# Patient Record
Sex: Female | Born: 1935 | Race: White | Hispanic: No | Smoking: Never smoker
Health system: Southern US, Community
[De-identification: ages and names within clinical notes are randomized; demographics above are authoritative.]

## PROBLEM LIST (undated history)

## (undated) DIAGNOSIS — D649 Anemia, unspecified: Secondary | ICD-10-CM

## (undated) DIAGNOSIS — F411 Generalized anxiety disorder: Secondary | ICD-10-CM

## (undated) DIAGNOSIS — E1129 Type 2 diabetes mellitus with other diabetic kidney complication: Secondary | ICD-10-CM

## (undated) DIAGNOSIS — M545 Low back pain: Secondary | ICD-10-CM

## (undated) DIAGNOSIS — E039 Hypothyroidism, unspecified: Secondary | ICD-10-CM

## (undated) DIAGNOSIS — J45909 Unspecified asthma, uncomplicated: Secondary | ICD-10-CM

## (undated) DIAGNOSIS — N183 Chronic kidney disease, stage 3 unspecified: Secondary | ICD-10-CM

## (undated) DIAGNOSIS — K219 Gastro-esophageal reflux disease without esophagitis: Secondary | ICD-10-CM

## (undated) DIAGNOSIS — G4700 Insomnia, unspecified: Secondary | ICD-10-CM

## (undated) DIAGNOSIS — E119 Type 2 diabetes mellitus without complications: Secondary | ICD-10-CM

## (undated) DIAGNOSIS — IMO0002 Reserved for concepts with insufficient information to code with codable children: Secondary | ICD-10-CM

## (undated) DIAGNOSIS — G4730 Sleep apnea, unspecified: Secondary | ICD-10-CM

## (undated) DIAGNOSIS — K5900 Constipation, unspecified: Secondary | ICD-10-CM

## (undated) DIAGNOSIS — I2510 Atherosclerotic heart disease of native coronary artery without angina pectoris: Secondary | ICD-10-CM

## (undated) DIAGNOSIS — N289 Disorder of kidney and ureter, unspecified: Secondary | ICD-10-CM

## (undated) DIAGNOSIS — E785 Hyperlipidemia, unspecified: Secondary | ICD-10-CM

## (undated) DIAGNOSIS — I10 Essential (primary) hypertension: Secondary | ICD-10-CM

## (undated) DIAGNOSIS — I219 Acute myocardial infarction, unspecified: Secondary | ICD-10-CM

## (undated) DIAGNOSIS — M4800 Spinal stenosis, site unspecified: Secondary | ICD-10-CM

## (undated) DIAGNOSIS — E871 Hypo-osmolality and hyponatremia: Secondary | ICD-10-CM

## (undated) DIAGNOSIS — E669 Obesity, unspecified: Secondary | ICD-10-CM

## (undated) DIAGNOSIS — R809 Proteinuria, unspecified: Secondary | ICD-10-CM

## (undated) DIAGNOSIS — M1990 Unspecified osteoarthritis, unspecified site: Secondary | ICD-10-CM

## (undated) DIAGNOSIS — R079 Chest pain, unspecified: Secondary | ICD-10-CM

## (undated) HISTORY — DX: Chronic kidney disease, stage 3 unspecified: N18.30

## (undated) HISTORY — DX: Spinal stenosis, site unspecified: M48.00

## (undated) HISTORY — DX: Sleep apnea, unspecified: G47.30

## (undated) HISTORY — PX: POSTERIOR LAMINECTOMY / DECOMPRESSION LUMBAR SPINE: SUR740

## (undated) HISTORY — DX: Proteinuria, unspecified: R80.9

## (undated) HISTORY — PX: CHOLECYSTECTOMY, LAPAROSCOPIC: SHX56

## (undated) HISTORY — DX: Disorder of kidney and ureter, unspecified: N28.9

## (undated) HISTORY — DX: Anemia, unspecified: D64.9

## (undated) HISTORY — DX: Reserved for concepts with insufficient information to code with codable children: IMO0002

## (undated) HISTORY — DX: Type 2 diabetes mellitus with other diabetic kidney complication: E11.29

## (undated) HISTORY — DX: Obesity, unspecified: E66.9

## (undated) HISTORY — DX: Generalized anxiety disorder: F41.1

## (undated) HISTORY — DX: Atherosclerotic heart disease of native coronary artery without angina pectoris: I25.10

## (undated) HISTORY — DX: Insomnia, unspecified: G47.00

## (undated) HISTORY — DX: Hypothyroidism, unspecified: E03.9

## (undated) HISTORY — DX: Hyperlipidemia, unspecified: E78.5

## (undated) HISTORY — DX: Constipation, unspecified: K59.00

## (undated) HISTORY — DX: Unspecified osteoarthritis, unspecified site: M19.90

## (undated) HISTORY — DX: Unspecified asthma, uncomplicated: J45.909

## (undated) HISTORY — DX: Chest pain, unspecified: R07.9

## (undated) HISTORY — DX: Low back pain: M54.5

## (undated) HISTORY — DX: Hypo-osmolality and hyponatremia: E87.1

## (undated) HISTORY — DX: Type 2 diabetes mellitus without complications: E11.9

## (undated) HISTORY — DX: Chronic kidney disease, stage 3 (moderate): N18.3

## (undated) HISTORY — DX: Gastro-esophageal reflux disease without esophagitis: K21.9

## (undated) HISTORY — DX: Morbid (severe) obesity due to excess calories: E66.01

## (undated) HISTORY — DX: Acute myocardial infarction, unspecified: I21.9

## (undated) HISTORY — DX: Essential (primary) hypertension: I10

---

## 1990-12-24 HISTORY — PX: TOTAL KNEE ARTHROPLASTY: SHX125

## 2003-06-11 ENCOUNTER — Ambulatory Visit (HOSPITAL_COMMUNITY): Admission: RE | Admit: 2003-06-11 | Discharge: 2003-06-11 | Payer: Self-pay | Admitting: Neurosurgery

## 2003-06-11 ENCOUNTER — Encounter: Payer: Self-pay | Admitting: Neurosurgery

## 2003-06-18 ENCOUNTER — Encounter: Payer: Self-pay | Admitting: Neurosurgery

## 2003-06-22 ENCOUNTER — Inpatient Hospital Stay (HOSPITAL_COMMUNITY): Admission: RE | Admit: 2003-06-22 | Discharge: 2003-06-24 | Payer: Self-pay | Admitting: Neurosurgery

## 2003-06-22 ENCOUNTER — Encounter: Payer: Self-pay | Admitting: Neurosurgery

## 2004-05-02 ENCOUNTER — Inpatient Hospital Stay (HOSPITAL_COMMUNITY): Admission: RE | Admit: 2004-05-02 | Discharge: 2004-05-04 | Payer: Self-pay | Admitting: Neurosurgery

## 2010-12-24 HISTORY — PX: SKIN CANCER EXCISION: SHX779

## 2011-01-13 ENCOUNTER — Encounter: Payer: Self-pay | Admitting: Neurosurgery

## 2011-11-09 ENCOUNTER — Other Ambulatory Visit: Payer: Self-pay

## 2011-11-09 ENCOUNTER — Other Ambulatory Visit: Payer: Self-pay | Admitting: *Deleted

## 2011-11-09 DIAGNOSIS — C490 Malignant neoplasm of connective and soft tissue of head, face and neck: Secondary | ICD-10-CM

## 2011-11-20 ENCOUNTER — Ambulatory Visit
Admission: RE | Admit: 2011-11-20 | Discharge: 2011-11-20 | Disposition: A | Discharge status: Home / Self Care | Payer: Self-pay | Source: Ambulatory Visit | Attending: *Deleted | Admitting: *Deleted

## 2011-11-20 DIAGNOSIS — C490 Malignant neoplasm of connective and soft tissue of head, face and neck: Secondary | ICD-10-CM

## 2011-11-20 MED ORDER — GADOBENATE DIMEGLUMINE 529 MG/ML IV SOLN
9.0000 mL | Freq: Once | INTRAVENOUS | Status: AC | PRN
  Administered 2011-11-20: 9 mL via INTRAVENOUS

## 2013-10-29 ENCOUNTER — Telehealth: Payer: Self-pay | Admitting: Family Medicine

## 2013-10-29 ENCOUNTER — Ambulatory Visit: Payer: BC Managed Care – HMO | Admitting: Family Medicine

## 2013-10-29 NOTE — Telephone Encounter (Signed)
Received medical records from [facility]. [place]  P: [phone] F: [phone]

## 2013-11-03 ENCOUNTER — Encounter: Payer: Self-pay | Admitting: Family Medicine

## 2013-11-03 ENCOUNTER — Ambulatory Visit (INDEPENDENT_AMBULATORY_CARE_PROVIDER_SITE_OTHER): Payer: BC Managed Care – HMO | Admitting: Family Medicine

## 2013-11-03 ENCOUNTER — Other Ambulatory Visit: Payer: Self-pay | Admitting: Family Medicine

## 2013-11-03 ENCOUNTER — Telehealth: Payer: Self-pay | Admitting: Family Medicine

## 2013-11-03 VITALS — BP 122/62 | HR 67 | Temp 97.6°F | Resp 16 | Ht 64.0 in | Wt 157.0 lb

## 2013-11-03 DIAGNOSIS — M545 Low back pain, unspecified: Secondary | ICD-10-CM

## 2013-11-03 DIAGNOSIS — I2510 Atherosclerotic heart disease of native coronary artery without angina pectoris: Secondary | ICD-10-CM

## 2013-11-03 DIAGNOSIS — D649 Anemia, unspecified: Secondary | ICD-10-CM

## 2013-11-03 DIAGNOSIS — Z23 Encounter for immunization: Secondary | ICD-10-CM

## 2013-11-03 DIAGNOSIS — R809 Proteinuria, unspecified: Secondary | ICD-10-CM

## 2013-11-03 DIAGNOSIS — M4800 Spinal stenosis, site unspecified: Secondary | ICD-10-CM | POA: Insufficient documentation

## 2013-11-03 DIAGNOSIS — I10 Essential (primary) hypertension: Secondary | ICD-10-CM

## 2013-11-03 DIAGNOSIS — E785 Hyperlipidemia, unspecified: Secondary | ICD-10-CM | POA: Insufficient documentation

## 2013-11-03 DIAGNOSIS — F411 Generalized anxiety disorder: Secondary | ICD-10-CM

## 2013-11-03 DIAGNOSIS — E039 Hypothyroidism, unspecified: Secondary | ICD-10-CM

## 2013-11-03 DIAGNOSIS — N058 Unspecified nephritic syndrome with other morphologic changes: Secondary | ICD-10-CM

## 2013-11-03 DIAGNOSIS — M1990 Unspecified osteoarthritis, unspecified site: Secondary | ICD-10-CM | POA: Insufficient documentation

## 2013-11-03 DIAGNOSIS — E871 Hypo-osmolality and hyponatremia: Secondary | ICD-10-CM

## 2013-11-03 DIAGNOSIS — G4700 Insomnia, unspecified: Secondary | ICD-10-CM

## 2013-11-03 DIAGNOSIS — G4730 Sleep apnea, unspecified: Secondary | ICD-10-CM

## 2013-11-03 DIAGNOSIS — J45909 Unspecified asthma, uncomplicated: Secondary | ICD-10-CM | POA: Insufficient documentation

## 2013-11-03 DIAGNOSIS — K5900 Constipation, unspecified: Secondary | ICD-10-CM

## 2013-11-03 DIAGNOSIS — IMO0002 Reserved for concepts with insufficient information to code with codable children: Secondary | ICD-10-CM | POA: Insufficient documentation

## 2013-11-03 DIAGNOSIS — K219 Gastro-esophageal reflux disease without esophagitis: Secondary | ICD-10-CM

## 2013-11-03 DIAGNOSIS — I219 Acute myocardial infarction, unspecified: Secondary | ICD-10-CM

## 2013-11-03 DIAGNOSIS — E669 Obesity, unspecified: Secondary | ICD-10-CM | POA: Insufficient documentation

## 2013-11-03 DIAGNOSIS — N289 Disorder of kidney and ureter, unspecified: Secondary | ICD-10-CM

## 2013-11-03 DIAGNOSIS — E1129 Type 2 diabetes mellitus with other diabetic kidney complication: Secondary | ICD-10-CM

## 2013-11-03 DIAGNOSIS — E119 Type 2 diabetes mellitus without complications: Secondary | ICD-10-CM

## 2013-11-03 DIAGNOSIS — R079 Chest pain, unspecified: Secondary | ICD-10-CM

## 2013-11-03 HISTORY — DX: Constipation, unspecified: K59.00

## 2013-11-03 HISTORY — DX: Low back pain, unspecified: M54.50

## 2013-11-03 MED ORDER — TRAMADOL HCL 50 MG PO TABS: 50.0000 mg | ORAL_TABLET | Freq: Three times a day (TID) | ORAL | Status: DC | PRN

## 2013-11-03 NOTE — Progress Notes (Signed)
Patient ID: Kathy Poole, female   DOB: 05-16-1929, 79 y.o.   MRN: [id number] Kathy Poole [id number] [birth year] 11/03/2013      Progress Note New Patient  Subjective  Chief Complaint  Chief Complaint  Patient presents with  . Establish Care    NP to Establish  . Immunizations    Pt request Flu & Pneumonia vaccines, has had pneumonia vaccine in past    HPI  Patient is a an 79 year old Caucasian female who is here today accompanied by her daughter. She has a significant past medical history which she has trouble providing succinctly. She is noted to have significant cardiac disease. Is seen in the [facility] a hospital and at [facility] for this. She has seen Dr. Baker Pierini at [facility] cardiology in the past as well. She had stents placed in 2006 and has recently been told she requires further stenting but has not followed up. Is having intermittent chest pain. It has been present for about 6 months. Occurs mostly with stress. She has episodes where it she feels short of breath and diaphoretic with the chest pain and other episodes where she does not. Denies palpitations. Does sometimes experience nausea. No pain in the office today. 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 She has suffered numerous falls and does have noted spinal stenosis and degenerative disease in her spine. Has chronic back pain. Also suffers with chronic constipation and chronic anxiety.   Past Medical History  Diagnosis Date  . HTN (hypertension)   . CAD (coronary artery disease)     s/p LAD & diagonal stent in 2006  . Hyperlipidemia   . GERD (gastroesophageal reflux disease)   . Diabetes type 2, controlled   . CKD (chronic kidney disease), stage III     from Diabetes  . Squamous cell carcinoma     Face  . Chest pain     w/palpitations, awaiting 3rd stent  . Osteoarthritis   . Spinal stenosis     Past Surgical  History  Procedure Laterality Date  . Total knee arthroplasty  1992  . Posterior laminectomy / decompression lumbar spine      x3  . Cholecystectomy, laparoscopic      Family History  Problem Relation Age of Onset  . Heart disease Brother   . Heart attack Mother   . Leukemia Father   . Pneumonia Mother     COD  . Renal Disease Mother   . Liver disease Brother   . Diabetes Brother     History   Social History  . Marital Status: Widowed    Spouse Name: N/A    Number of Children: N/A  . Years of Education: N/A   Occupational History  . Not on file.   Social History Main Topics  . Smoking status: Never Smoker   . Smokeless tobacco: Not on file  . Alcohol Use: No  . Drug Use: No  . Sexual Activity: Not on file   Other Topics Concern  . Not on file   Social History Narrative   Patient is a retired Manufacturing engineer. She denies tobacco, alcohol or illicit drugs.    No current outpatient prescriptions on file prior to visit.   No current facility-administered medications on file prior to visit.    Allergies  Allergen Reactions  . Diltiazem   . Morphine And Related   . Other     Duragesics.  Marland Kitchen  Penicillins   . Prednisone   . Rofecoxib   . Bacitracin Rash    Review of Systems  Review of Systems  Constitutional: Positive for malaise/fatigue. Negative for fever and chills.  HENT: Negative for congestion, hearing loss and nosebleeds.   Eyes: Negative for discharge.  Respiratory: Positive for shortness of breath. Negative for cough, sputum production and wheezing.   Cardiovascular: Positive for chest pain. Negative for palpitations and leg swelling.  Gastrointestinal: Positive for nausea. Negative for heartburn, vomiting, abdominal pain, diarrhea, constipation and blood in stool.  Genitourinary: Negative for dysuria, urgency, frequency and hematuria.  Musculoskeletal: Negative for back pain, falls and myalgias.  Skin: Negative for rash.  Neurological:  Negative for dizziness, tremors, sensory change, focal weakness, loss of consciousness, weakness and headaches.  Endo/Heme/Allergies: Negative for polydipsia. Does not bruise/bleed easily.  Psychiatric/Behavioral: Negative for depression and suicidal ideas. The patient is nervous/anxious. The patient does not have insomnia.     Objective  BP 122/62  Pulse 67  Temp(Src) 97.6 F (36.4 C) (Oral)  Resp 16  Ht 5\' 4"  (1.626 m)  Wt 157 lb (71.215 kg)  BMI 26.94 kg/m2  SpO2 98%  Physical Exam  Physical Exam  Constitutional: She is oriented to person, place, and time and well-developed, well-nourished, and in no distress. No distress.  HENT:  Head: Normocephalic and atraumatic.  Right Ear: External ear normal.  Left Ear: External ear normal.  Nose: Nose normal.  Mouth/Throat: Oropharynx is clear and moist. No oropharyngeal exudate.  Eyes: Conjunctivae are normal. Pupils are equal, round, and reactive to light. Right eye exhibits no discharge. Left eye exhibits no discharge. No scleral icterus.  Neck: Normal range of motion. Neck supple. No thyromegaly present.  Cardiovascular: Normal rate, regular rhythm, normal heart sounds and intact distal pulses.   No murmur heard. Pulmonary/Chest: Effort normal and breath sounds normal. No respiratory distress. She has no wheezes. She has no rales.  Abdominal: Soft. Bowel sounds are normal. She exhibits no distension and no mass. There is no tenderness.  Musculoskeletal: Normal range of motion. She exhibits no edema and no tenderness.  Lymphadenopathy:    She has no cervical adenopathy.  Neurological: She is alert and oriented to person, place, and time. She has normal reflexes. No cranial nerve deficit. Coordination normal.  Skin: Skin is warm and dry. No rash noted. She is not diaphoretic.  Scar right temple  Psychiatric: Mood, memory and affect normal.       Assessment & Plan  HTN (hypertension) Well controlled at today's visit, no  changes to meds  CAD (coronary artery disease) Patient requests referral to new cardiologist, appt scheduled for 11/12 but patient cancelled. She reports significant CAD and stenosis and records confirm. She does have intermittent CP often associated with stress and this has been ongoing for roughly 6 months. She reports when she relaxes it improves. She is encouraged to proceed with specialty care and to present to ED if pain occurs and does not remit.  Type 2 diabetes, controlled, with renal manifestation hgba1c 7.8. Patient encouraged to avoid simple carbs and continue current meds. paient encouraged to consider nutritionist appt in future. Reviewed old records. Renal insufficiency essentially stable  Unspecified constipation Encouraged probiotics, fiber supplements and plenty of fluids  Hypothyroid tsh wnl, continue current dose of levothyroxine  Proteinuria On Lisinopril  Anemia Mild and stable

## 2013-11-03 NOTE — Telephone Encounter (Signed)
Received medical records from [facility]  P: [phone] F: [phone]

## 2013-11-03 NOTE — Patient Instructions (Signed)
Probiotic daily Digestive Advantage daily MV with iron and minerals every other day Fiber such as Metamucil or Benefiber Every 3rd day if no BM 4 oz of warm prunejuice with 2 tbls of Milk of Mag if no results in 4 hours repeat with a Dulcolax suppository in the rectum    Preventive Care for Adults, Female A healthy lifestyle and preventive care can promote health and wellness. Preventive health guidelines for women include the following key practices.  A routine yearly physical is a good way to check with your caregiver about your health and preventive screening. It is a chance to share any concerns and updates on your health, and to receive a thorough exam.  Visit your dentist for a routine exam and preventive care every 6 months. Brush your teeth twice a day and floss once a day. Good oral hygiene prevents tooth decay and gum disease.  The frequency of eye exams is based on your age, health, family medical history, use of contact lenses, and other factors. Follow your caregiver's recommendations for frequency of eye exams.  Eat a healthy diet. Foods like vegetables, fruits, whole grains, low-fat dairy products, and lean protein foods contain the nutrients you need without too many calories. Decrease your intake of foods high in solid fats, added sugars, and salt. Eat the right amount of calories for you.Get information about a proper diet from your caregiver, if necessary.  Regular physical exercise is one of the most important things you can do for your health. Most adults should get at least 150 minutes of moderate-intensity exercise (any activity that increases your heart rate and causes you to sweat) each week. In addition, most adults need muscle-strengthening exercises on 2 or more days a week.  Maintain a healthy weight. The body mass index (BMI) is a screening tool to identify possible weight problems. It provides an estimate of body fat based on height and weight. Your caregiver can  help determine your BMI, and can help you achieve or maintain a healthy weight.For adults 20 years and older:  A BMI below 18.5 is considered underweight.  A BMI of 18.5 to 24.9 is normal.  A BMI of 25 to 29.9 is considered overweight.  A BMI of 30 and above is considered obese.  Maintain normal blood lipids and cholesterol levels by exercising and minimizing your intake of saturated fat. Eat a balanced diet with plenty of fruit and vegetables. Blood tests for lipids and cholesterol should begin at age 57 and be repeated every 5 years. If your lipid or cholesterol levels are high, you are over 50, or you are at high risk for heart disease, you may need your cholesterol levels checked more frequently.Ongoing high lipid and cholesterol levels should be treated with medicines if diet and exercise are not effective.  If you smoke, find out from your caregiver how to quit. If you do not use tobacco, do not start.  Lung cancer screening is recommended for adults aged 16 80 years who are at high risk for developing lung cancer because of a history of smoking. Yearly low-dose computed tomography (CT) is recommended for people who have at least a 30-pack-year history of smoking and are a current smoker or have quit within the past 15 years. A pack year of smoking is smoking an average of 1 pack of cigarettes a day for 1 year (for example: 1 pack a day for 30 years or 2 packs a day for 15 years). Yearly screening should continue until  the smoker has stopped smoking for at least 15 years. Yearly screening should also be stopped for people who develop a health problem that would prevent them from having lung cancer treatment.  If you are pregnant, do not drink alcohol. If you are breastfeeding, be very cautious about drinking alcohol. If you are not pregnant and choose to drink alcohol, do not exceed 1 drink per day. One drink is considered to be 12 ounces (355 mL) of beer, 5 ounces (148 mL) of wine, or 1.5  ounces (44 mL) of liquor.  Avoid use of street drugs. Do not share needles with anyone. Ask for help if you need support or instructions about stopping the use of drugs.  High blood pressure causes heart disease and increases the risk of stroke. Your blood pressure should be checked at least every 1 to 2 years. Ongoing high blood pressure should be treated with medicines if weight loss and exercise are not effective.  If you are 64 to 77 years old, ask your caregiver if you should take aspirin to prevent strokes.  Diabetes screening involves taking a blood sample to check your fasting blood sugar level. This should be done once every 3 years, after age 37, if you are within normal weight and without risk factors for diabetes. Testing should be considered at a younger age or be carried out more frequently if you are overweight and have at least 1 risk factor for diabetes.  Breast cancer screening is essential preventive care for women. You should practice "breast self-awareness." This means understanding the normal appearance and feel of your breasts and may include breast self-examination. Any changes detected, no matter how small, should be reported to a caregiver. Women in their 49s and 30s should have a clinical breast exam (CBE) by a caregiver as part of a regular health exam every 1 to 3 years. After age 82, women should have a CBE every year. Starting at age 56, women should consider having a mammography (breast X-ray test) every year. Women who have a family history of breast cancer should talk to their caregiver about genetic screening. Women at a high risk of breast cancer should talk to their caregivers about having magnetic resonance imaging (MRI) and a mammography every year.  Breast cancer gene (BRCA)-related cancer risk assessment is recommended for women who have family members with BRCA-related cancers. BRCA-related cancers include breast, ovarian, tubal, and peritoneal cancers. Having  family members with these cancers may be associated with an increased risk for harmful changes (mutations) in the breast cancer genes BRCA1 and BRCA2. Results of the assessment will determine the need for genetic counseling and BRCA1 and BRCA2 testing.  The Pap test is a screening test for cervical cancer. A Pap test can show cell changes on the cervix that might become cervical cancer if left untreated. A Pap test is a procedure in which cells are obtained and examined from the lower end of the uterus (cervix).  Women should have a Pap test starting at age 81.  Between ages 46 and 28, Pap tests should be repeated every 2 years.  Beginning at age 60, you should have a Pap test every 3 years as long as the past 3 Pap tests have been normal.  Some women have medical problems that increase the chance of getting cervical cancer. Talk to your caregiver about these problems. It is especially important to talk to your caregiver if a new problem develops soon after your last Pap test. In these  cases, your caregiver may recommend more frequent screening and Pap tests.  The above recommendations are the same for women who have or have not gotten the vaccine for human papillomavirus (HPV).  If you had a hysterectomy for a problem that was not cancer or a condition that could lead to cancer, then you no longer need Pap tests. Even if you no longer need a Pap test, a regular exam is a good idea to make sure no other problems are starting.  If you are between ages 19 and 17, and you have had normal Pap tests going back 10 years, you no longer need Pap tests. Even if you no longer need a Pap test, a regular exam is a good idea to make sure no other problems are starting.  If you have had past treatment for cervical cancer or a condition that could lead to cancer, you need Pap tests and screening for cancer for at least 20 years after your treatment.  If Pap tests have been discontinued, risk factors (such as a  new sexual partner) need to be reassessed to determine if screening should be resumed.  The HPV test is an additional test that may be used for cervical cancer screening. The HPV test looks for the virus that can cause the cell changes on the cervix. The cells collected during the Pap test can be tested for HPV. The HPV test could be used to screen women aged 106 years and older, and should be used in women of any age who have unclear Pap test results. After the age of 42, women should have HPV testing at the same frequency as a Pap test.  Colorectal cancer can be detected and often prevented. Most routine colorectal cancer screening begins at the age of 74 and continues through age 3. However, your caregiver may recommend screening at an earlier age if you have risk factors for colon cancer. On a yearly basis, your caregiver may provide home test kits to check for hidden blood in the stool. Use of a small camera at the end of a tube, to directly examine the colon (sigmoidoscopy or colonoscopy), can detect the earliest forms of colorectal cancer. Talk to your caregiver about this at age 63, when routine screening begins. Direct examination of the colon should be repeated every 5 to 10 years through age 12, unless early forms of pre-cancerous polyps or small growths are found.  Hepatitis C blood testing is recommended for all people born from [birth year] through 1963 and any individual with known risks for hepatitis C.  Practice safe sex. Use condoms and avoid high-risk sexual practices to reduce the spread of sexually transmitted infections (STIs). STIs include gonorrhea, chlamydia, syphilis, trichomonas, herpes, HPV, and human immunodeficiency virus (HIV). Herpes, HIV, and HPV are viral illnesses that have no cure. They can result in disability, cancer, and death. Sexually active women aged 58 and younger should be checked for chlamydia. Older women with new or multiple partners should also be tested for  chlamydia. Testing for other STIs is recommended if you are sexually active and at increased risk.  Osteoporosis is a disease in which the bones lose minerals and strength with aging. This can result in serious bone fractures. The risk of osteoporosis can be identified using a bone density scan. Women ages 17 and over and women at risk for fractures or osteoporosis should discuss screening with their caregivers. Ask your caregiver whether you should take a calcium supplement or vitamin D to reduce  the rate of osteoporosis.  Menopause can be associated with physical symptoms and risks. Hormone replacement therapy is available to decrease symptoms and risks. You should talk to your caregiver about whether hormone replacement therapy is right for you.  Use sunscreen. Apply sunscreen liberally and repeatedly throughout the day. You should seek shade when your shadow is shorter than you. Protect yourself by wearing long sleeves, pants, a wide-brimmed hat, and sunglasses year round, whenever you are outdoors.  Once a month, do a whole body skin exam, using a mirror to look at the skin on your back. Notify your caregiver of new moles, moles that have irregular borders, moles that are larger than a pencil eraser, or moles that have changed in shape or color.  Stay current with required immunizations.  Influenza vaccine. All adults should be immunized every year.  Tetanus, diphtheria, and acellular pertussis (Td, Tdap) vaccine. Pregnant women should receive 1 dose of Tdap vaccine during each pregnancy. The dose should be obtained regardless of the length of time since the last dose. Immunization is preferred during the 27th to 36th week of gestation. An adult who has not previously received Tdap or who does not know her vaccine status should receive 1 dose of Tdap. This initial dose should be followed by tetanus and diphtheria toxoids (Td) booster doses every 10 years. Adults with an unknown or incomplete  history of completing a 3-dose immunization series with Td-containing vaccines should begin or complete a primary immunization series including a Tdap dose. Adults should receive a Td booster every 10 years.  Varicella vaccine. An adult without evidence of immunity to varicella should receive 2 doses or a second dose if she has previously received 1 dose. Pregnant females who do not have evidence of immunity should receive the first dose after pregnancy. This first dose should be obtained before leaving the health care facility. The second dose should be obtained 4 8 weeks after the first dose.  Human papillomavirus (HPV) vaccine. Females aged 12 26 years who have not received the vaccine previously should obtain the 3-dose series. The vaccine is not recommended for use in pregnant females. However, pregnancy testing is not needed before receiving a dose. If a female is found to be pregnant after receiving a dose, no treatment is needed. In that case, the remaining doses should be delayed until after the pregnancy. Immunization is recommended for any person with an immunocompromised condition through the age of 26 years if she did not get any or all doses earlier. During the 3-dose series, the second dose should be obtained 4 8 weeks after the first dose. The third dose should be obtained 24 weeks after the first dose and 16 weeks after the second dose.  Zoster vaccine. One dose is recommended for adults aged 49 years or older unless certain conditions are present.  Measles, mumps, and rubella (MMR) vaccine. Adults born before [birth year] generally are considered immune to measles and mumps. Adults born in [birth year] or later should have 1 or more doses of MMR vaccine unless there is a contraindication to the vaccine or there is laboratory evidence of immunity to each of the three diseases. A routine second dose of MMR vaccine should be obtained at least 28 days after the first dose for students attending postsecondary  schools, health care workers, or international travelers. People who received inactivated measles vaccine or an unknown type of measles vaccine during 1963 1967 should receive 2 doses of MMR vaccine. People who received inactivated mumps  vaccine or an unknown type of mumps vaccine before 1979 and are at high risk for mumps infection should consider immunization with 2 doses of MMR vaccine. For females of childbearing age, rubella immunity should be determined. If there is no evidence of immunity, females who are not pregnant should be vaccinated. If there is no evidence of immunity, females who are pregnant should delay immunization until after pregnancy. Unvaccinated health care workers born before [birth year] who lack laboratory evidence of measles, mumps, or rubella immunity or laboratory confirmation of disease should consider measles and mumps immunization with 2 doses of MMR vaccine or rubella immunization with 1 dose of MMR vaccine.  Pneumococcal 13-valent conjugate (PCV13) vaccine. When indicated, a person who is uncertain of her immunization history and has no record of immunization should receive the PCV13 vaccine. An adult aged 63 years or older who has certain medical conditions and has not been previously immunized should receive 1 dose of PCV13 vaccine. This PCV13 should be followed with a dose of pneumococcal polysaccharide (PPSV23) vaccine. The PPSV23 vaccine dose should be obtained at least 8 weeks after the dose of PCV13 vaccine. An adult aged 26 years or older who has certain medical conditions and previously received 1 or more doses of PPSV23 vaccine should receive 1 dose of PCV13. The PCV13 vaccine dose should be obtained 1 or more years after the last PPSV23 vaccine dose.  Pneumococcal polysaccharide (PPSV23) vaccine. When PCV13 is also indicated, PCV13 should be obtained first. All adults aged 73 years and older should be immunized. An adult younger than age 50 years who has certain medical  conditions should be immunized. Any person who resides in a nursing home or long-term care facility should be immunized. An adult smoker should be immunized. People with an immunocompromised condition and certain other conditions should receive both PCV13 and PPSV23 vaccines. People with human immunodeficiency virus (HIV) infection should be immunized as soon as possible after diagnosis. Immunization during chemotherapy or radiation therapy should be avoided. Routine use of PPSV23 vaccine is not recommended for American Indians, [address], or people younger than 65 years unless there are medical conditions that require PPSV23 vaccine. When indicated, people who have unknown immunization and have no record of immunization should receive PPSV23 vaccine. One-time revaccination 5 years after the first dose of PPSV23 is recommended for people aged 57 64 years who have chronic kidney failure, nephrotic syndrome, asplenia, or immunocompromised conditions. People who received 1 2 doses of PPSV23 before age 30 years should receive another dose of PPSV23 vaccine at age 56 years or later if at least 5 years have passed since the previous dose. Doses of PPSV23 are not needed for people immunized with PPSV23 at or after age 31 years.  Meningococcal vaccine. Adults with asplenia or persistent complement component deficiencies should receive 2 doses of quadrivalent meningococcal conjugate (MenACWY-D) vaccine. The doses should be obtained at least 2 months apart. Microbiologists working with certain meningococcal bacteria, military recruits, people at risk during an outbreak, and people who travel to or live in countries with a high rate of meningitis should be immunized. A first-year college student up through age 60 years who is living in a residence hall should receive a dose if she did not receive a dose on or after her 16th birthday. Adults who have certain high-risk conditions should receive one or more doses of  vaccine.  Hepatitis A vaccine. Adults who wish to be protected from this disease, have certain high-risk conditions, work with  hepatitis A-infected animals, work in hepatitis A research labs, or travel to or work in countries with a high rate of hepatitis A should be immunized. Adults who were previously unvaccinated and who anticipate close contact with an international adoptee during the first 60 days after arrival in the [place] from a country with a high rate of hepatitis A should be immunized.  Hepatitis B vaccine. Adults who wish to be protected from this disease, have certain high-risk conditions, may be exposed to blood or other infectious body fluids, are household contacts or sex partners of hepatitis B positive people, are clients or workers in certain care facilities, or travel to or work in countries with a high rate of hepatitis B should be immunized.  Haemophilus influenzae type b (Hib) vaccine. A previously unvaccinated person with asplenia or sickle cell disease or having a scheduled splenectomy should receive 1 dose of Hib vaccine. Regardless of previous immunization, a recipient of a hematopoietic stem cell transplant should receive a 3-dose series 6 12 months after her successful transplant. Hib vaccine is not recommended for adults with HIV infection. Preventive Services / Frequency Ages 38 to 65  Blood pressure check.** / Every 1 to 2 years.  Lipid and cholesterol check.** / Every 5 years beginning at age 70.  Clinical breast exam.** / Every 3 years for women in their 46s and 30s.  BRCA-related cancer risk assessment.** / For women who have family members with a BRCA-related cancer (breast, ovarian, tubal, or peritoneal cancers).  Pap test.** / Every 2 years from ages 63 through 70. Every 3 years starting at age 19 through age 81 or 106 with a history of 3 consecutive normal Pap tests.  HPV screening.** / Every 3 years from ages 26 through ages 49 to 51 with a history  of 3 consecutive normal Pap tests.  Hepatitis C blood test.** / For any individual with known risks for hepatitis C.  Skin self-exam. / Monthly.  Influenza vaccine. / Every year.  Tetanus, diphtheria, and acellular pertussis (Tdap, Td) vaccine.** / Consult your caregiver. Pregnant women should receive 1 dose of Tdap vaccine during each pregnancy. 1 dose of Td every 10 years.  Varicella vaccine.** / Consult your caregiver. Pregnant females who do not have evidence of immunity should receive the first dose after pregnancy.  HPV vaccine. / 3 doses over 6 months, if 26 and younger. The vaccine is not recommended for use in pregnant females. However, pregnancy testing is not needed before receiving a dose.  Measles, mumps, rubella (MMR) vaccine.** / You need at least 1 dose of MMR if you were born in 1955 or later. You may also need a 2nd dose. For females of childbearing age, rubella immunity should be determined. If there is no evidence of immunity, females who are not pregnant should be vaccinated. If there is no evidence of immunity, females who are pregnant should delay immunization until after pregnancy.  Pneumococcal 13-valent conjugate (PCV13) vaccine.** / Consult your caregiver.  Pneumococcal polysaccharide (PPSV23) vaccine.** / 1 to 2 doses if you smoke cigarettes or if you have certain conditions.  Meningococcal vaccine.** / 1 dose if you are age 24 to 65 years and a Orthoptist living in a residence hall, or have one of several medical conditions, you need to get vaccinated against meningococcal disease. You may also need additional booster doses.  Hepatitis A vaccine.** / Consult your caregiver.  Hepatitis B vaccine.** / Consult your caregiver.  Haemophilus influenzae type b (Hib)  vaccine.** / Consult your caregiver. Ages 75 to 35  Blood pressure check.** / Every 1 to 2 years.  Lipid and cholesterol check.** / Every 5 years beginning at age 67.  Lung cancer  screening. / Every year if you are aged 26 80 years and have a 30-pack-year history of smoking and currently smoke or have quit within the past 15 years. Yearly screening is stopped once you have quit smoking for at least 15 years or develop a health problem that would prevent you from having lung cancer treatment.  Clinical breast exam.** / Every year after age 44.  BRCA-related cancer risk assessment.** / For women who have family members with a BRCA-related cancer (breast, ovarian, tubal, or peritoneal cancers).  Mammogram.** / Every year beginning at age 33 and continuing for as long as you are in good health. Consult with your caregiver.  Pap test.** / Every 3 years starting at age 69 through age 66 or 25 with a history of 3 consecutive normal Pap tests.  HPV screening.** / Every 3 years from ages 92 through ages 2 to 77 with a history of 3 consecutive normal Pap tests.  Fecal occult blood test (FOBT) of stool. / Every year beginning at age 80 and continuing until age 60. You may not need to do this test if you get a colonoscopy every 10 years.  Flexible sigmoidoscopy or colonoscopy.** / Every 5 years for a flexible sigmoidoscopy or every 10 years for a colonoscopy beginning at age 32 and continuing until age 20.  Hepatitis C blood test.** / For all people born from [birth year] through 1963 and any individual with known risks for hepatitis C.  Skin self-exam. / Monthly.  Influenza vaccine. / Every year.  Tetanus, diphtheria, and acellular pertussis (Tdap/Td) vaccine.** / Consult your caregiver. Pregnant women should receive 1 dose of Tdap vaccine during each pregnancy. 1 dose of Td every 10 years.  Varicella vaccine.** / Consult your caregiver. Pregnant females who do not have evidence of immunity should receive the first dose after pregnancy.  Zoster vaccine.** / 1 dose for adults aged 46 years or older.  Measles, mumps, rubella (MMR) vaccine.** / You need at least 1 dose of MMR if you  were born in 1957 or later. You may also need a 2nd dose. For females of childbearing age, rubella immunity should be determined. If there is no evidence of immunity, females who are not pregnant should be vaccinated. If there is no evidence of immunity, females who are pregnant should delay immunization until after pregnancy.  Pneumococcal 13-valent conjugate (PCV13) vaccine.** / Consult your caregiver.  Pneumococcal polysaccharide (PPSV23) vaccine.** / 1 to 2 doses if you smoke cigarettes or if you have certain conditions.  Meningococcal vaccine.** / Consult your caregiver.  Hepatitis A vaccine.** / Consult your caregiver.  Hepatitis B vaccine.** / Consult your caregiver.  Haemophilus influenzae type b (Hib) vaccine.** / Consult your caregiver. Ages 48 and over  Blood pressure check.** / Every 1 to 2 years.  Lipid and cholesterol check.** / Every 5 years beginning at age 70.  Lung cancer screening. / Every year if you are aged 6 80 years and have a 30-pack-year history of smoking and currently smoke or have quit within the past 15 years. Yearly screening is stopped once you have quit smoking for at least 15 years or develop a health problem that would prevent you from having lung cancer treatment.  Clinical breast exam.** / Every year after age 80.  BRCA-related  cancer risk assessment.** / For women who have family members with a BRCA-related cancer (breast, ovarian, tubal, or peritoneal cancers).  Mammogram.** / Every year beginning at age 34 and continuing for as long as you are in good health. Consult with your caregiver.  Pap test.** / Every 3 years starting at age 44 through age 50 or 62 with a 3 consecutive normal Pap tests. Testing can be stopped between 65 and 70 with 3 consecutive normal Pap tests and no abnormal Pap or HPV tests in the past 10 years.  HPV screening.** / Every 3 years from ages 67 through ages 67 or 11 with a history of 3 consecutive normal Pap tests.  Testing can be stopped between 65 and 70 with 3 consecutive normal Pap tests and no abnormal Pap or HPV tests in the past 10 years.  Fecal occult blood test (FOBT) of stool. / Every year beginning at age 69 and continuing until age 41. You may not need to do this test if you get a colonoscopy every 10 years.  Flexible sigmoidoscopy or colonoscopy.** / Every 5 years for a flexible sigmoidoscopy or every 10 years for a colonoscopy beginning at age 33 and continuing until age 17.  Hepatitis C blood test.** / For all people born from [birth year] through 1963 and any individual with known risks for hepatitis C.  Osteoporosis screening.** / A one-time screening for women ages 25 and over and women at risk for fractures or osteoporosis.  Skin self-exam. / Monthly.  Influenza vaccine. / Every year.  Tetanus, diphtheria, and acellular pertussis (Tdap/Td) vaccine.** / 1 dose of Td every 10 years.  Varicella vaccine.** / Consult your caregiver.  Zoster vaccine.** / 1 dose for adults aged 27 years or older.  Pneumococcal 13-valent conjugate (PCV13) vaccine.** / Consult your caregiver.  Pneumococcal polysaccharide (PPSV23) vaccine.** / 1 dose for all adults aged 12 years and older.  Meningococcal vaccine.** / Consult your caregiver.  Hepatitis A vaccine.** / Consult your caregiver.  Hepatitis B vaccine.** / Consult your caregiver.  Haemophilus influenzae type b (Hib) vaccine.** / Consult your caregiver. ** Family history and personal history of risk and conditions may change your caregiver's recommendations. Document Released: 02/05/2002 Document Revised: 04/06/2013 Document Reviewed: 05/07/2011 [facility] Patient Information 2014 [place], [place].

## 2013-11-03 NOTE — Telephone Encounter (Signed)
Received medical records from [facility]  P: [phone] F: [phone]

## 2013-11-03 NOTE — Progress Notes (Signed)
Pre visit review using our clinic review tool, if applicable. No additional management support is needed unless otherwise documented below in the visit note/SLS  

## 2013-11-04 ENCOUNTER — Institutional Professional Consult (permissible substitution): Payer: BC Managed Care – HMO | Admitting: Cardiology

## 2013-11-04 LAB — HEPATIC FUNCTION PANEL
AST: 21 U/L (ref 0–37)
Alkaline Phosphatase: 39 U/L (ref 39–117)
Bilirubin, Direct: 0.1 mg/dL (ref 0.0–0.3)
Indirect Bilirubin: 0.2 mg/dL (ref 0.0–0.9)
Total Bilirubin: 0.3 mg/dL (ref 0.3–1.2)

## 2013-11-04 LAB — URINALYSIS, ROUTINE W REFLEX MICROSCOPIC
Bilirubin Urine: NEGATIVE
Glucose, UA: 250 mg/dL — AB
Ketones, ur: NEGATIVE mg/dL
Leukocytes, UA: NEGATIVE

## 2013-11-04 LAB — RENAL FUNCTION PANEL: Potassium: 4.9 mEq/L (ref 3.5–5.3)

## 2013-11-04 LAB — CBC
HCT: 35 % — ABNORMAL LOW (ref 36.0–46.0)
MCHC: 33.4 g/dL (ref 30.0–36.0)
MCV: 93.3 fL (ref 78.0–100.0)
Platelets: 156 10*3/uL (ref 150–400)
RDW: 14.6 % (ref 11.5–15.5)

## 2013-11-04 LAB — MICROALBUMIN / CREATININE URINE RATIO
Creatinine, Urine: 36.5 mg/dL
Microalb Creat Ratio: 150.1 mg/g — ABNORMAL HIGH (ref 0.0–30.0)

## 2013-11-04 LAB — LIPID PANEL
HDL: 32 mg/dL — ABNORMAL LOW (ref >39)
Total CHOL/HDL Ratio: 4.5 Ratio
Triglycerides: 159 mg/dL — ABNORMAL HIGH (ref <150)
VLDL: 32 mg/dL (ref 0–40)

## 2013-11-08 ENCOUNTER — Encounter: Payer: Self-pay | Admitting: Family Medicine

## 2013-11-08 DIAGNOSIS — E871 Hypo-osmolality and hyponatremia: Secondary | ICD-10-CM

## 2013-11-08 DIAGNOSIS — G4700 Insomnia, unspecified: Secondary | ICD-10-CM

## 2013-11-08 DIAGNOSIS — K219 Gastro-esophageal reflux disease without esophagitis: Secondary | ICD-10-CM

## 2013-11-08 DIAGNOSIS — N289 Disorder of kidney and ureter, unspecified: Secondary | ICD-10-CM

## 2013-11-08 DIAGNOSIS — G4730 Sleep apnea, unspecified: Secondary | ICD-10-CM | POA: Insufficient documentation

## 2013-11-08 DIAGNOSIS — F411 Generalized anxiety disorder: Secondary | ICD-10-CM

## 2013-11-08 DIAGNOSIS — R809 Proteinuria, unspecified: Secondary | ICD-10-CM | POA: Insufficient documentation

## 2013-11-08 DIAGNOSIS — D649 Anemia, unspecified: Secondary | ICD-10-CM

## 2013-11-08 DIAGNOSIS — E039 Hypothyroidism, unspecified: Secondary | ICD-10-CM

## 2013-11-08 HISTORY — DX: Insomnia, unspecified: G47.00

## 2013-11-08 HISTORY — DX: Anemia, unspecified: D64.9

## 2013-11-08 HISTORY — DX: Proteinuria, unspecified: R80.9

## 2013-11-08 HISTORY — DX: Disorder of kidney and ureter, unspecified: N28.9

## 2013-11-08 HISTORY — DX: Sleep apnea, unspecified: G47.30

## 2013-11-08 HISTORY — DX: Hypo-osmolality and hyponatremia: E87.1

## 2013-11-08 HISTORY — DX: Gastro-esophageal reflux disease without esophagitis: K21.9

## 2013-11-08 HISTORY — DX: Morbid (severe) obesity due to excess calories: E66.01

## 2013-11-08 HISTORY — DX: Generalized anxiety disorder: F41.1

## 2013-11-08 HISTORY — DX: Hypothyroidism, unspecified: E03.9

## 2013-11-08 NOTE — Assessment & Plan Note (Signed)
Patient requests referral to new cardiologist, appt scheduled for 11/12 but patient cancelled. She reports significant CAD and stenosis and records confirm. She does have intermittent CP often associated with stress and this has been ongoing for roughly 6 months. She reports when she relaxes it improves. She is encouraged to proceed with specialty care and to present to ED if pain occurs and does not remit.

## 2013-11-08 NOTE — Assessment & Plan Note (Signed)
hgba1c 7.8. Patient encouraged to avoid simple carbs and continue current meds. paient encouraged to consider nutritionist appt in future. Reviewed old records. Renal insufficiency essentially stable

## 2013-11-08 NOTE — Assessment & Plan Note (Signed)
tsh wnl, continue current dose of levothyroxine

## 2013-11-08 NOTE — Assessment & Plan Note (Signed)
Well controlled at today's visit, no changes to meds

## 2013-11-08 NOTE — Assessment & Plan Note (Signed)
On Lisinopril 

## 2013-11-08 NOTE — Assessment & Plan Note (Signed)
Encouraged probiotics, fiber supplements and plenty of fluids

## 2013-11-08 NOTE — Assessment & Plan Note (Signed)
Mild and stable 

## 2013-11-26 ENCOUNTER — Ambulatory Visit: Payer: BC Managed Care – HMO | Admitting: Family Medicine

## 2014-01-19 ENCOUNTER — Telehealth: Payer: Self-pay | Admitting: Family Medicine

## 2014-01-19 DIAGNOSIS — M545 Low back pain, unspecified: Secondary | ICD-10-CM

## 2014-01-19 DIAGNOSIS — K5900 Constipation, unspecified: Secondary | ICD-10-CM

## 2014-01-19 DIAGNOSIS — Z23 Encounter for immunization: Secondary | ICD-10-CM

## 2014-01-19 DIAGNOSIS — I10 Essential (primary) hypertension: Secondary | ICD-10-CM

## 2014-01-19 DIAGNOSIS — E119 Type 2 diabetes mellitus without complications: Secondary | ICD-10-CM

## 2014-01-19 NOTE — Telephone Encounter (Signed)
Please advise refills 

## 2014-01-19 NOTE — Telephone Encounter (Signed)
Patient daughter called in requesting that we send a 90 day supply of all medications but lisinopril to [facility]

## 2014-01-20 NOTE — Telephone Encounter (Signed)
OK to rx 90 supply with 1 rf to [facility] of all meds except lisinopril, Nitrostat and Tramadol.

## 2014-01-21 MED ORDER — FENOFIBRATE 160 MG PO TABS: 160.0000 mg | ORAL_TABLET | Freq: Every day | ORAL | Status: DC

## 2014-01-21 MED ORDER — LEVOTHYROXINE SODIUM 50 MCG PO TABS: 50.0000 ug | ORAL_TABLET | Freq: Every day | ORAL | Status: AC

## 2014-01-21 MED ORDER — AMLODIPINE BESYLATE 10 MG PO TABS: 10.0000 mg | ORAL_TABLET | Freq: Every day | ORAL | Status: DC

## 2014-01-21 MED ORDER — FUROSEMIDE 20 MG PO TABS: 20.0000 mg | ORAL_TABLET | Freq: Every day | ORAL | Status: AC

## 2014-01-21 MED ORDER — PRAVASTATIN SODIUM 40 MG PO TABS: 40.0000 mg | ORAL_TABLET | Freq: Every day | ORAL | Status: DC

## 2014-01-21 MED ORDER — METOPROLOL TARTRATE 25 MG PO TABS: 25.0000 mg | ORAL_TABLET | Freq: Every day | ORAL | Status: AC

## 2014-01-21 MED ORDER — CLOPIDOGREL BISULFATE 75 MG PO TABS: 75.0000 mg | ORAL_TABLET | Freq: Every day | ORAL | Status: AC

## 2014-01-21 NOTE — Telephone Encounter (Signed)
Patient daughter called back stating that patient was in [facility] from 01/11/14-01/15/14 and that the physician there took patient off of lisinopril and put her on hydralazine. Faxing records request to [facility] for Hospital stay.

## 2014-01-22 NOTE — Telephone Encounter (Signed)
Rx sent to mail order pharmacy per EMR by April Miller, CMA/SLS

## 2014-01-28 ENCOUNTER — Telehealth: Payer: Self-pay | Admitting: Family Medicine

## 2014-01-28 NOTE — Telephone Encounter (Signed)
Patient daughter would like a meter refill to mail order pharmacy

## 2014-01-29 MED ORDER — ACCU-CHEK SOFTCLIX LANCETS MISC: Status: AC

## 2014-01-29 MED ORDER — GLUCOSE BLOOD VI STRP: ORAL_STRIP | Status: AC

## 2014-01-29 MED ORDER — ACCU-CHEK AVIVA PLUS W/DEVICE KIT: PACK | Status: AC

## 2014-01-29 NOTE — Telephone Encounter (Signed)
Rx request to pharmacy per daughter/SLS

## 2014-02-02 ENCOUNTER — Telehealth: Payer: Self-pay | Admitting: *Deleted

## 2014-02-02 MED ORDER — HYDRALAZINE HCL 25 MG PO TABS: 25.0000 mg | ORAL_TABLET | Freq: Two times a day (BID) | ORAL | Status: AC

## 2014-02-02 NOTE — Telephone Encounter (Signed)
Pt's daughter called stating she requested a refill of hydralazine be sent to [facility] last week but when medications arrived it was not included. She states pt was placed on this while in [place] recently and it was to replace Lisinopril. Hospital records are scanned in to [facility].  Please advise if ok to send refill and make change on med list?

## 2014-02-02 NOTE — Telephone Encounter (Signed)
Informed patient of medication refill and patient daughter scheduled appointment for 05/11/14. Patient did not want to come in any earlier.

## 2014-02-02 NOTE — Telephone Encounter (Signed)
Please call pt's daughter to arrange appt as advised below.

## 2014-02-02 NOTE — Telephone Encounter (Signed)
She is past due for follow up with Dr. Charlett Blake. I will send refill, but she needs to be scheduled for hospital follow up.  The records I seen in EPIC appear to be from ? December.

## 2014-02-22 ENCOUNTER — Telehealth: Payer: Self-pay | Admitting: Family Medicine

## 2014-02-22 NOTE — Telephone Encounter (Signed)
Please advise 

## 2014-02-22 NOTE — Telephone Encounter (Signed)
No I do not know who Dr Maudie Mercury is.Maybe her daughter mentioned it when she was in for her appt but I do not recall. I only met them once and they had many, many issues.  I assume I wanted to see them again. They should have come back by now. If they want to come in to discuss the headaches they should to that.

## 2014-02-22 NOTE — Telephone Encounter (Signed)
Patient daughter called in wanting to know if Dr. Charlett Blake had talked to Dr. Maudie Mercury about scheduling a PET scan for patient. She states that patient is still having headaches.

## 2014-02-23 NOTE — Telephone Encounter (Signed)
Can you please call and inform the patients daughter about MD's message below. I tried calling the number stated that the patient called from and the guy said there is no one there by that name.  thanks

## 2014-02-23 NOTE — Telephone Encounter (Signed)
I spoke to patient daughter and explained to her that we do not know who Dr. Maudie Mercury is, she states that she is patients RAD Oncologist. I explained to her that Dr. Charlett Blake would need to see her again before she orders a PET scan or for patient to call Dr. Julianne Rice office to see if that office could order PET Scan.

## 2014-03-02 NOTE — Telephone Encounter (Signed)
I left a message on number listed in chart for pts daughter to return my call. i also left a message at [phone] (deborah)

## 2014-03-02 NOTE — Telephone Encounter (Signed)
Spoke with Dr Maudie Mercury, patient's previous radiation oncologist who is now at [facility]. She would like the patient to transfer her case to a local radiation oncologist for ease of access. Patient is c/o HA worsening. Would benefit from CT head and neck and/or MRI. Patient has only been in once and likely needs to come in again but am willing to try to order CT if she wants. Please check with patient. I could also transfer her to a radiation oncologist at [facility] as well

## 2014-03-25 ENCOUNTER — Ambulatory Visit: Payer: BC Managed Care – HMO | Admitting: Physician Assistant

## 2014-03-31 ENCOUNTER — Ambulatory Visit (HOSPITAL_BASED_OUTPATIENT_CLINIC_OR_DEPARTMENT_OTHER)
Admission: RE | Admit: 2014-03-31 | Discharge: 2014-03-31 | Disposition: A | Discharge status: Home / Self Care | Payer: Medicare HMO | Source: Ambulatory Visit | Attending: Physician Assistant | Admitting: Physician Assistant

## 2014-03-31 ENCOUNTER — Ambulatory Visit (INDEPENDENT_AMBULATORY_CARE_PROVIDER_SITE_OTHER): Payer: Medicare HMO | Admitting: Physician Assistant

## 2014-03-31 ENCOUNTER — Encounter: Payer: Self-pay | Admitting: Physician Assistant

## 2014-03-31 VITALS — BP 140/74 | HR 77 | Temp 97.9°F | Resp 16 | Ht 64.0 in | Wt 165.0 lb

## 2014-03-31 DIAGNOSIS — IMO0002 Reserved for concepts with insufficient information to code with codable children: Secondary | ICD-10-CM

## 2014-03-31 DIAGNOSIS — M5416 Radiculopathy, lumbar region: Secondary | ICD-10-CM | POA: Insufficient documentation

## 2014-03-31 DIAGNOSIS — M545 Low back pain, unspecified: Secondary | ICD-10-CM | POA: Insufficient documentation

## 2014-03-31 DIAGNOSIS — M5137 Other intervertebral disc degeneration, lumbosacral region: Secondary | ICD-10-CM | POA: Insufficient documentation

## 2014-03-31 DIAGNOSIS — H7290 Unspecified perforation of tympanic membrane, unspecified ear: Secondary | ICD-10-CM | POA: Insufficient documentation

## 2014-03-31 DIAGNOSIS — Z981 Arthrodesis status: Secondary | ICD-10-CM | POA: Insufficient documentation

## 2014-03-31 DIAGNOSIS — M51379 Other intervertebral disc degeneration, lumbosacral region without mention of lumbar back pain or lower extremity pain: Secondary | ICD-10-CM | POA: Insufficient documentation

## 2014-03-31 NOTE — Progress Notes (Signed)
Pre visit review using our clinic review tool, if applicable. No additional management support is needed unless otherwise documented below in the visit note/SLS  

## 2014-03-31 NOTE — Patient Instructions (Addendum)
Please obtain x-ray.  I will call you with your results.  You will be contacted by Neurosurgery for further evaluation. I want to avoid stronger medications due to being on Plavix and due to renal insufficency.  Avoid heavy lifting or overexertion.  You will be contacted by ENT for further evaluation for possible perforated ear drum and hearing loss.

## 2014-03-31 NOTE — Assessment & Plan Note (Signed)
Referral to ENT placed for further need for treatment.

## 2014-03-31 NOTE — Assessment & Plan Note (Signed)
Will obtain x-ray lumbar spine.  Referral to specialty placed.

## 2014-03-31 NOTE — Progress Notes (Signed)
Patient presents to clinic today c/o popping and sudden hearing loss of left ear ~ 2 months ago. Hearing loss has persisted.  Endorses occasional "popping" sound in ear.  Denies hx of significant allergies. Patient denies tinnitus, ear drainage, fever, sinus pressure, sinus pain.  Denies symptoms of right ear.   Patient also with hx of lumbar stenosis requiring multiple laminectomies.  Symptoms not controlled with OTC medication.  Was given Tramadol by PCP without significant relief.  Patient denies saddle anesthesia or change to bowel/bladder habits.  Past Medical History  Diagnosis Date  . CAD (coronary artery disease)     s/p LAD & diagonal stent in 2006  . GERD (gastroesophageal reflux disease)   . CKD (chronic kidney disease), stage III     from Diabetes  . Chest pain     w/palpitations, awaiting 3rd stent  . Spinal stenosis   . Osteoarthritis   . Squamous cell carcinoma     Face  . Diabetes type 2, controlled   . HTN (hypertension)   . Hyperlipidemia   . Myocardial infarction     2006 with 2 stents  . Obesity   . Asthma   . Hypothyroid   . Unspecified constipation 11/03/2013  . Low back pain 11/03/2013    Has had 3 laminectomies at 5,4,7 and 1 discectomy Last surgeries 2004 and 2005, Dr Luiz Ochoa at [facility]  . Hyponatremia 11/08/2013  . Anemia 11/08/2013  . Morbid obesity 11/08/2013  . Generalized anxiety disorder 11/08/2013  . Unspecified sleep apnea 11/08/2013  . Insomnia 11/08/2013  . Proteinuria 11/08/2013  . Esophageal reflux 11/08/2013  . Renal insufficiency 11/08/2013  . Unspecified hypothyroidism 11/08/2013  . Type 2 diabetes, controlled, with renal manifestation     Current Outpatient Prescriptions on File Prior to Visit  Medication Sig Dispense Refill  . ACCU-CHEK SOFTCLIX LANCETS lancets Use as instructed to test blood glucose Dx: 250.40  100 each  5  . amLODipine (NORVASC) 10 MG tablet Take 1 tablet (10 mg total) by mouth daily.  90 tablet  1  . aspirin 81 MG  tablet Take 81 mg by mouth daily.      . Blood Glucose Monitoring Suppl (ACCU-CHEK AVIVA PLUS) W/DEVICE KIT USE AS DIRECTED TO TEST BLOOD GLUCOSE DX: 250.40  1 kit  0  . clopidogrel (PLAVIX) 75 MG tablet Take 1 tablet (75 mg total) by mouth daily.  90 tablet  1  . fenofibrate 160 MG tablet Take 1 tablet (160 mg total) by mouth daily.  90 tablet  1  . furosemide (LASIX) 20 MG tablet Take 1 tablet (20 mg total) by mouth daily.  90 tablet  1  . glucose blood test strip Use as instructed to test blood glucose Dx: 250.40  100 each  5  . hydrALAZINE (APRESOLINE) 25 MG tablet Take 1 tablet (25 mg total) by mouth 2 (two) times daily.  180 tablet  0  . insulin glargine (LANTUS) 100 UNIT/ML injection Inject 70 Units into the skin every morning.      . insulin lispro (HUMALOG) 100 UNIT/ML injection Inject 6-8 Units into the skin 3 (three) times daily before meals.      Marland Kitchen levothyroxine (SYNTHROID, LEVOTHROID) 50 MCG tablet Take 1 tablet (50 mcg total) by mouth daily before breakfast.  90 tablet  1  . metoprolol tartrate (LOPRESSOR) 25 MG tablet Take 1 tablet (25 mg total) by mouth daily.  90 tablet  1  . nitroGLYCERIN (NITROSTAT) 0.4 MG SL tablet Place 0.4  mg under the tongue every 5 (five) minutes as needed for chest pain.      Marland Kitchen omeprazole (PRILOSEC OTC) 20 MG tablet Take 20 mg by mouth daily as needed.      . pravastatin (PRAVACHOL) 40 MG tablet Take 1 tablet (40 mg total) by mouth at bedtime.  90 tablet  1   No current facility-administered medications on file prior to visit.   Allergies  Allergen Reactions  . Diltiazem   . Morphine And Related   . Other     Duragesics.  . Penicillins   . Prednisone   . Rofecoxib   . Bacitracin Rash    Family History  Problem Relation Age of Onset  . Heart disease Brother   . Heart attack Mother   . Leukemia Father   . Pneumonia Mother     COD  . Renal Disease Mother   . Liver disease Brother   . Diabetes Brother     History   Social History  .  Marital Status: Widowed    Spouse Name: N/A    Number of Children: N/A  . Years of Education: N/A   Social History Main Topics  . Smoking status: Never Smoker   . Smokeless tobacco: None  . Alcohol Use: No  . Drug Use: No  . Sexual Activity: No   Other Topics Concern  . None   Social History Narrative   Patient is a retired Control and instrumentation engineer. She denies tobacco, alcohol or illicit drugs.   Review of Systems - See HPI.  All other ROS are negative.  BP 140/74  Pulse 77  Temp(Src) 97.9 F (36.6 C) (Oral)  Resp 16  Ht $R'5\' 4"'Xq$  (1.626 m)  Wt 165 lb (74.844 kg)  BMI 28.31 kg/m2  SpO2 97%  Physical Exam  Vitals reviewed. Constitutional: She is oriented to person, place, and time and well-developed, well-nourished, and in no distress.  HENT:  Head: Normocephalic and atraumatic.  Right Ear: External ear normal.  Left Ear: External ear normal.  Nose: Nose normal.  Mouth/Throat: Oropharynx is clear and moist. No oropharyngeal exudate.  R TM within normal limits.  L TM hard to visualize due to cerumen.  Small perforation of L TM noted on exam.  Eyes: Conjunctivae are normal. Pupils are equal, round, and reactive to light.  Neck: Neck supple.  Cardiovascular: Normal rate and regular rhythm.   Pulmonary/Chest: Effort normal and breath sounds normal. No respiratory distress. She has no wheezes. She has no rales. She exhibits no tenderness.  Neurological: She is alert and oriented to person, place, and time.  Skin: Skin is warm and dry. No rash noted.  Psychiatric: Affect normal.   Assessment/Plan: Lumbar radicular pain Will obtain x-ray lumbar spine.  Referral to specialty placed.  Tympanic membrane perforation Referral to ENT placed for further need for treatment.

## 2014-04-06 ENCOUNTER — Encounter: Payer: Self-pay | Admitting: Family Medicine

## 2014-04-09 ENCOUNTER — Encounter: Payer: Self-pay | Admitting: Family Medicine

## 2014-04-15 ENCOUNTER — Telehealth: Payer: Self-pay | Admitting: Family Medicine

## 2014-04-15 NOTE — Telephone Encounter (Signed)
Spoke with daughter, Hilda Blades, she is aware of last Xray results/SLS

## 2014-04-15 NOTE — Telephone Encounter (Signed)
The only results I see are from 31-9 and Einar Pheasant did them?

## 2014-04-15 NOTE — Telephone Encounter (Signed)
Requesting xray results.

## 2014-04-27 ENCOUNTER — Telehealth: Payer: Self-pay | Admitting: Family Medicine

## 2014-04-27 DIAGNOSIS — E119 Type 2 diabetes mellitus without complications: Secondary | ICD-10-CM

## 2014-04-27 DIAGNOSIS — I10 Essential (primary) hypertension: Secondary | ICD-10-CM

## 2014-04-27 DIAGNOSIS — M545 Low back pain, unspecified: Secondary | ICD-10-CM

## 2014-04-27 DIAGNOSIS — K5900 Constipation, unspecified: Secondary | ICD-10-CM

## 2014-04-27 DIAGNOSIS — Z23 Encounter for immunization: Secondary | ICD-10-CM

## 2014-04-27 DIAGNOSIS — R5381 Other malaise: Secondary | ICD-10-CM

## 2014-04-27 MED ORDER — TRAMADOL HCL 50 MG PO TABS: 50.0000 mg | ORAL_TABLET | Freq: Three times a day (TID) | ORAL | Status: AC | PRN

## 2014-04-27 NOTE — Telephone Encounter (Signed)
Faxed RX for bedside commode

## 2014-04-27 NOTE — Telephone Encounter (Signed)
Message copied by Varney Daily on Tue Apr 27, 2014  2:58 PM ------      Message from: Irven Baltimore      Created: Tue Apr 27, 2014  2:05 PM       Randall Hiss from [facility] called stating that the fax# is [phone]  ------

## 2014-04-27 NOTE — Telephone Encounter (Signed)
[place]

## 2014-04-27 NOTE — Telephone Encounter (Signed)
My belief is insurance will want a face to face visit within 30 days for something as big as a hospital bed. OK to write for bedside commode for debility and OK to refill Tramadol, same sig same number

## 2014-04-27 NOTE — Telephone Encounter (Signed)
Please advise 

## 2014-04-27 NOTE — Telephone Encounter (Signed)
SHE WANTS THE BED AND COMMODE FROM [facility] SHE ALSO WANTS A REFILL OF TRAMADOL TO [facility]

## 2014-04-27 NOTE — Telephone Encounter (Signed)
I spoke to patients daughter and they have an appt on 05-03-14. RX for tramadol faxed and pts daughter is going to call back with the number to fax the bedside

## 2014-04-28 ENCOUNTER — Telehealth: Payer: Self-pay | Admitting: Family Medicine

## 2014-04-28 NOTE — Telephone Encounter (Signed)
Relevant patient education assigned to patient using Emmi. ° °

## 2014-05-06 NOTE — Telephone Encounter (Signed)
Closing note 

## 2014-05-11 ENCOUNTER — Ambulatory Visit: Payer: BC Managed Care – HMO | Admitting: Family Medicine

## 2014-05-13 ENCOUNTER — Encounter: Payer: Self-pay | Admitting: Family Medicine

## 2014-07-07 ENCOUNTER — Telehealth: Payer: Self-pay | Admitting: Family Medicine

## 2014-07-07 DIAGNOSIS — I10 Essential (primary) hypertension: Secondary | ICD-10-CM

## 2014-07-07 DIAGNOSIS — E785 Hyperlipidemia, unspecified: Secondary | ICD-10-CM

## 2014-07-07 MED ORDER — AMLODIPINE BESYLATE 10 MG PO TABS: 10.0000 mg | ORAL_TABLET | Freq: Every day | ORAL | Status: AC

## 2014-07-07 MED ORDER — PRAVASTATIN SODIUM 40 MG PO TABS: 40.0000 mg | ORAL_TABLET | Freq: Every day | ORAL | Status: AC

## 2014-07-07 MED ORDER — FENOFIBRATE 160 MG PO TABS: 160.0000 mg | ORAL_TABLET | Freq: Every day | ORAL | Status: AC

## 2014-07-07 NOTE — Telephone Encounter (Signed)
Refill granted. No additional refills without follow-up with PCP.

## 2014-07-07 NOTE — Telephone Encounter (Signed)
Requesting refill on:  Pravastatin 40mg , take 1 tab @ bedtime  Fenofibrate 160mg , take 1 tab everyday  Amlodipine 10mg , take 1 tab everyday  90 day supply, please call into [place] [facility] [phone]

## 2015-01-19 IMAGING — CR DG LUMBAR SPINE COMPLETE 4+V
5 series · 5 of 5 positions shown · non-contrast
Comparison: 05/02/2004

CLINICAL DATA: Low back pain

EXAM:
LUMBAR SPINE - COMPLETE 4+ VIEW

[t l-spine a.p.]
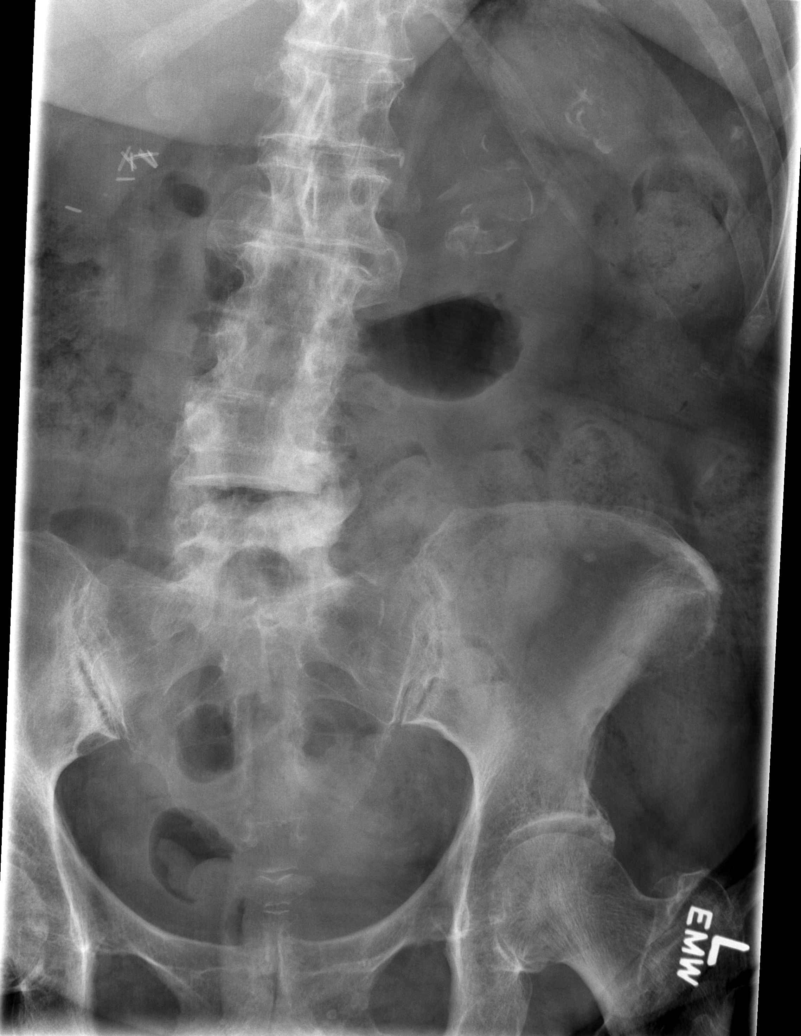

[t l-spine oblique exposure (1 of 2)]
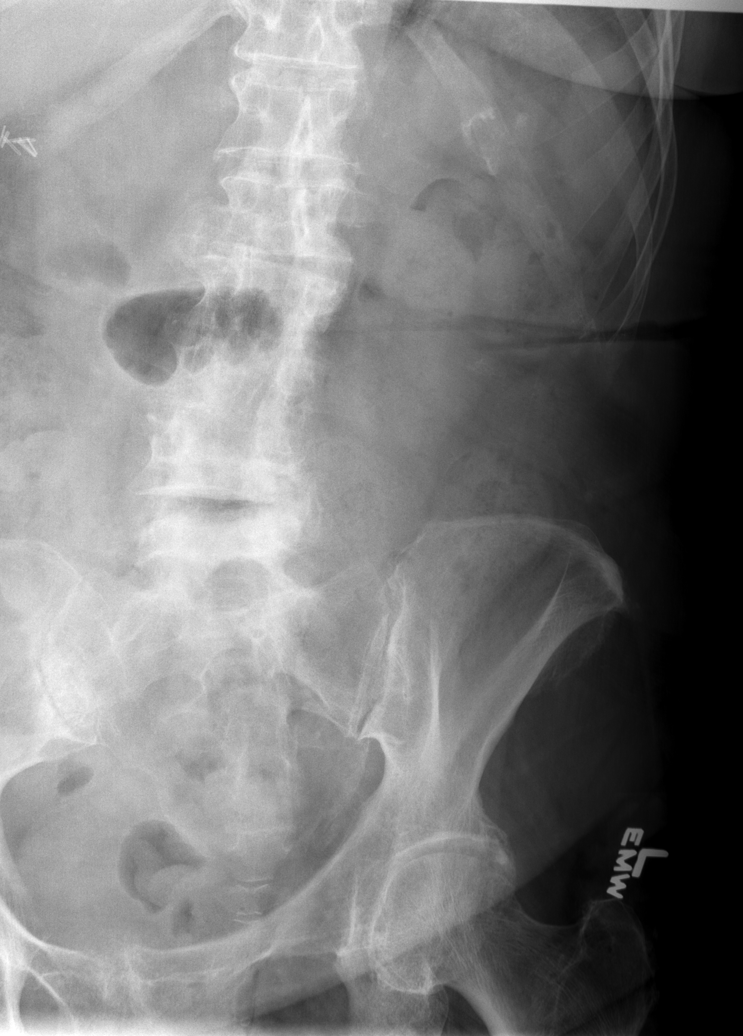

[t l-spine oblique exposure (2 of 2)]
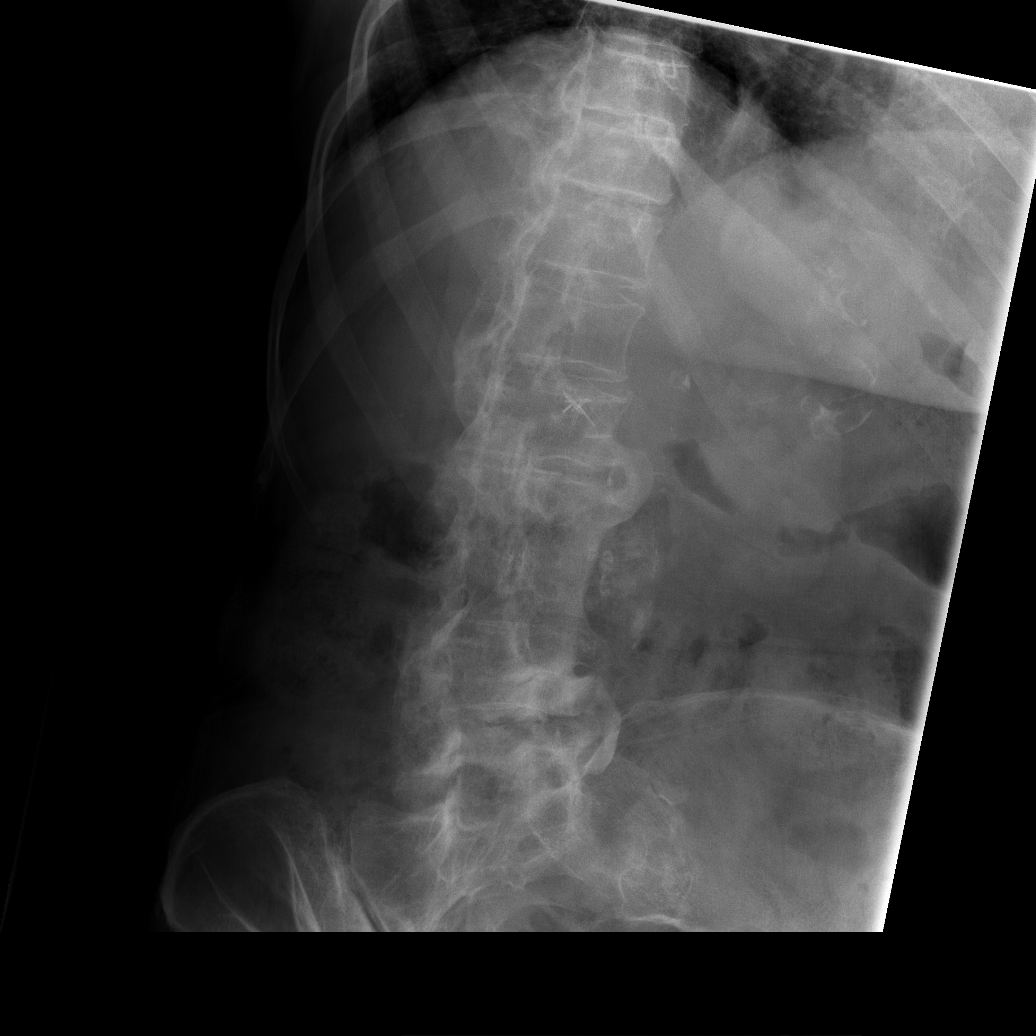

[t l-spine lat]
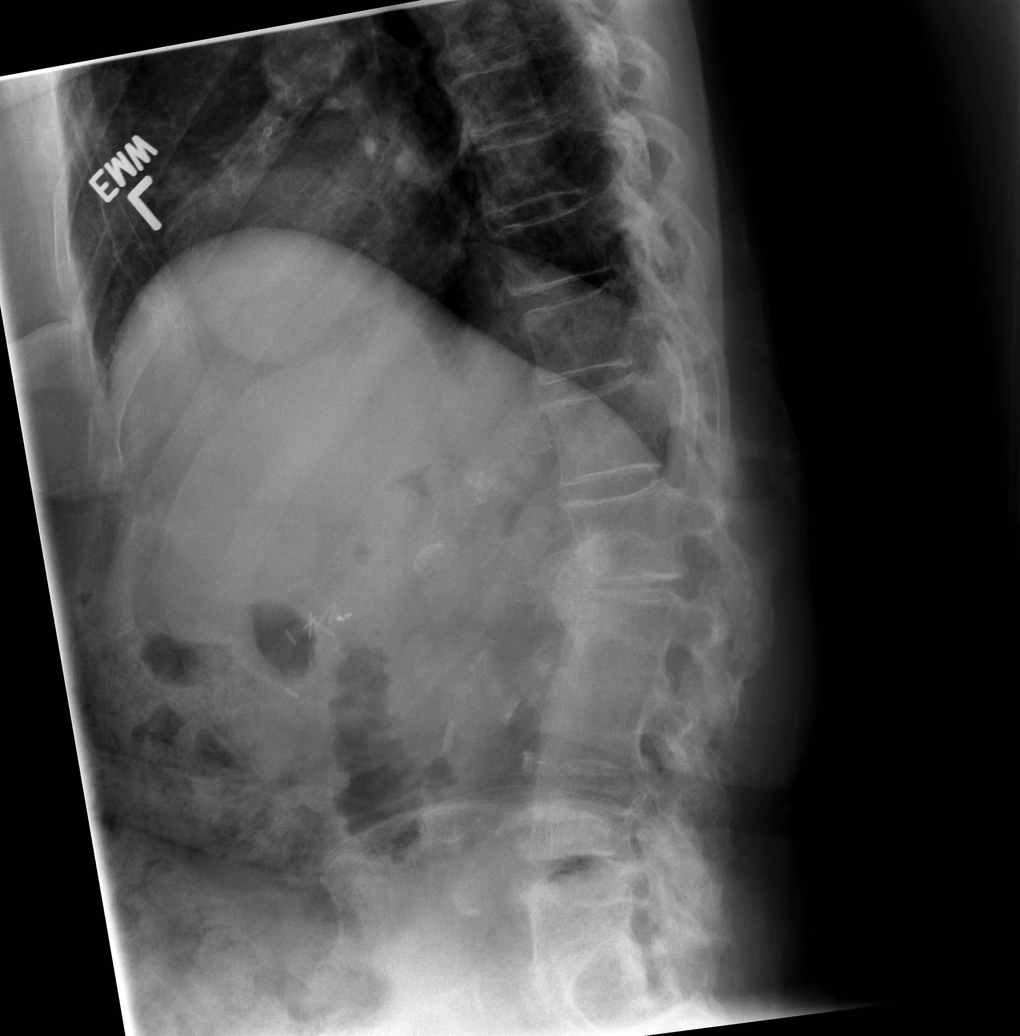

[t l-spine l5-s1 spot]
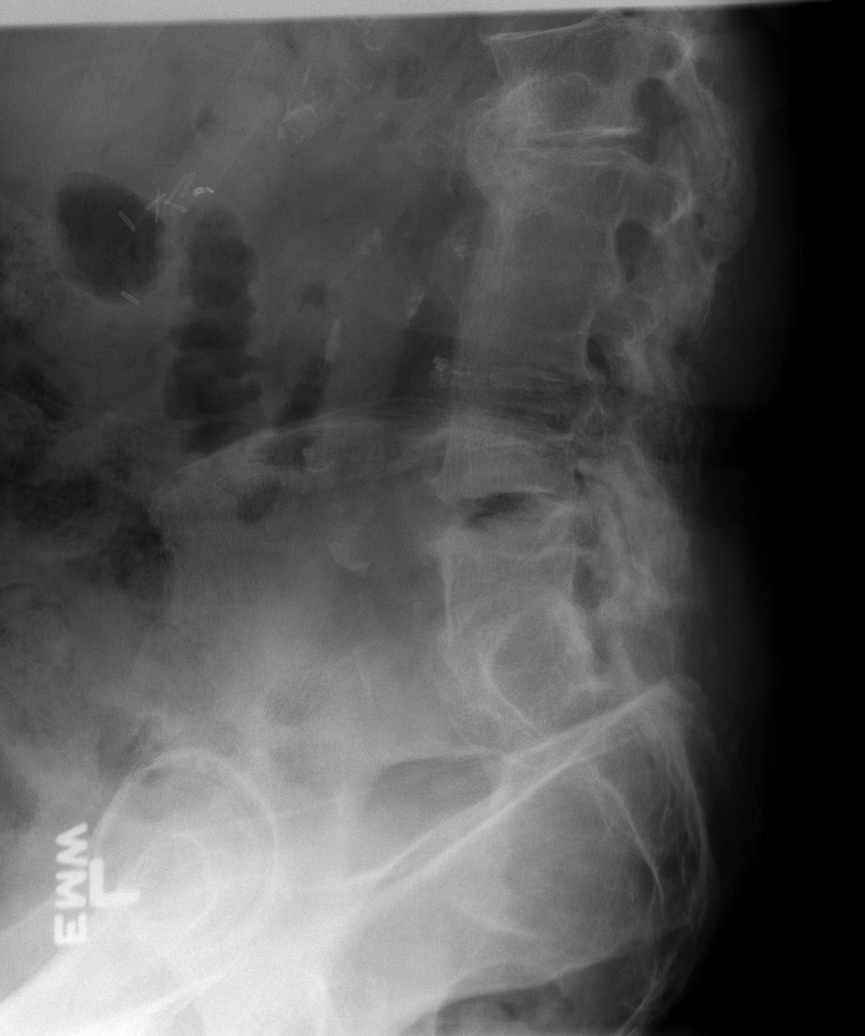

[5 of 5 positions shown; findings below may reference images not displayed]

FINDINGS: Solid-appearing interbody fusion L2-L4, and posterior decompression.
Marked vacuum phenomenon in the L4-5 interspace with sclerosis and
some irregularity of the superior endplate of L5. There is narrowing
of the L1-2 interspace with large anterior bridging osteophytes.
Negative for fracture, dislocation, or other acute bone abnormality.
Surgical clips in the right upper abdomen. Patchy aortic
calcifications without suggestion of aneurysm.
IMPRESSION: 1. Negative for fracture or other acute bone abnormality.
2. Interbody fusion L2-L4 with progressive adjacent level
degenerative disc disease L4-5.

## 2015-06-20 ENCOUNTER — Other Ambulatory Visit: Payer: Self-pay

## 2016-03-24 DEATH — deceased
